# Patient Record
Sex: Female | Born: 2000 | Race: Black or African American | Hispanic: No | Marital: Single
Health system: Southern US, Community
[De-identification: ages and names within clinical notes are randomized; demographics above are authoritative.]

---
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
## 2022-02-02 IMAGING — US US ABDOMEN LIMITED RUQ/ASCITES
1 series · 14 of 25 positions shown · non-contrast
Comparison: None.

CLINICAL DATA: Right upper quadrant pain

EXAM:
ULTRASOUND ABDOMEN LIMITED RIGHT UPPER QUADRANT

[Series 1: us abdomen limited ruq/ascites · 14 of 42 slices shown]
[im 1/42]
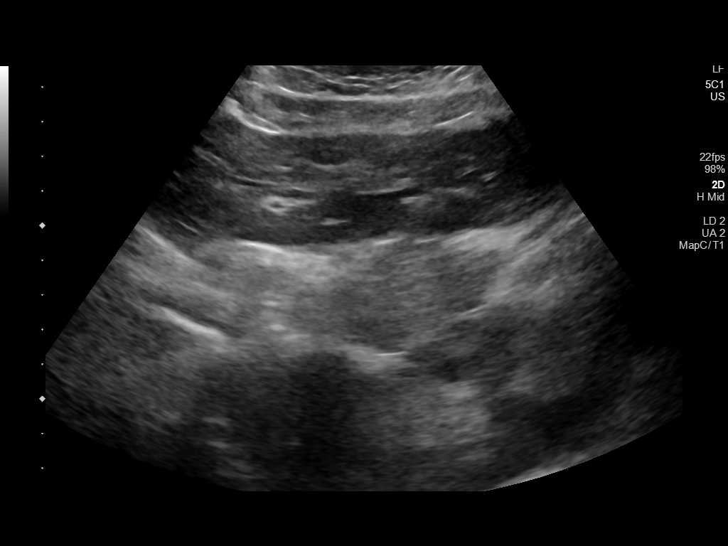
[im 4/42]
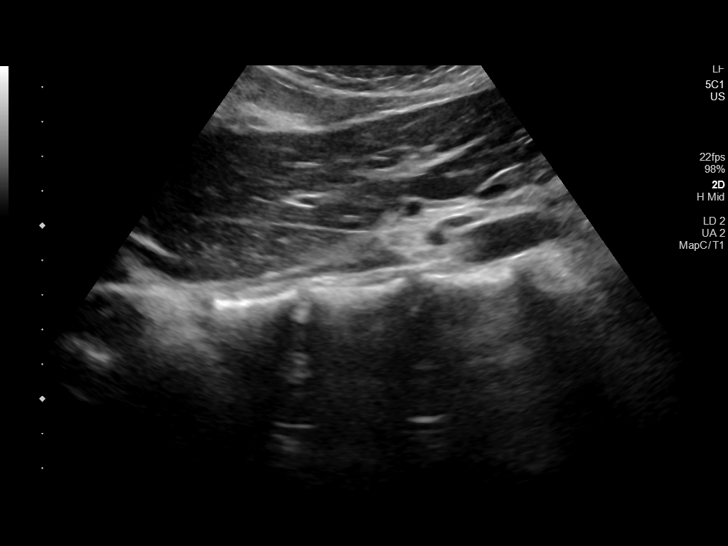
[im 7/42]
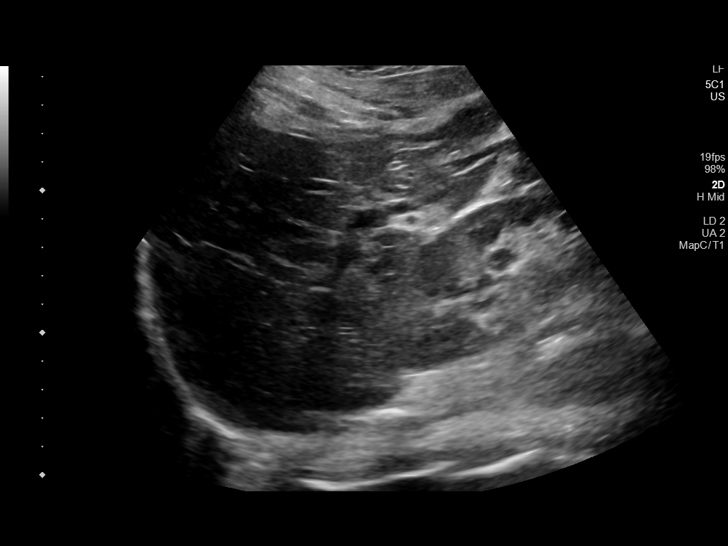
[im 11/42]
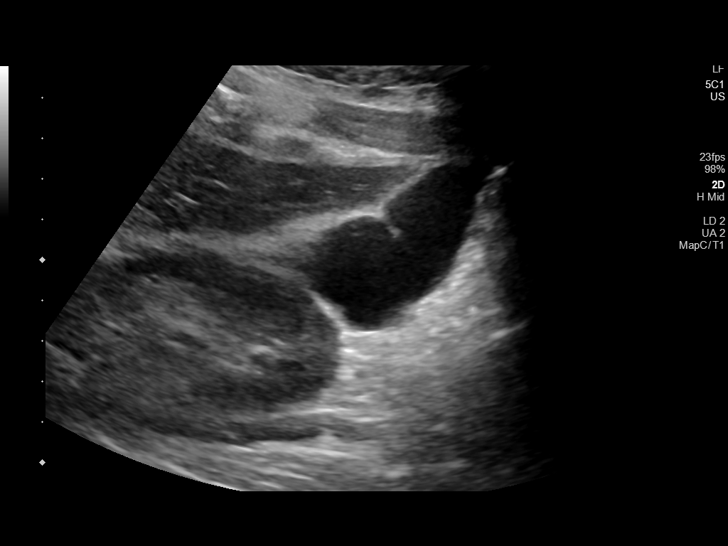
[im 14/42]
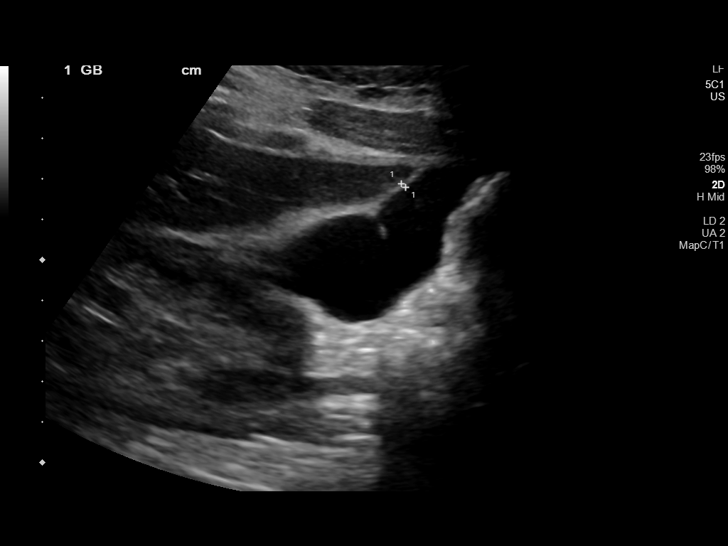
[im 16/42]
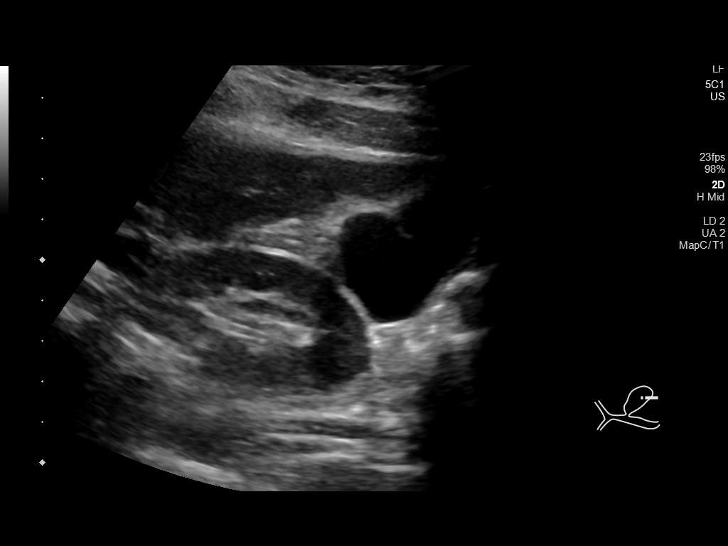
[im 19/42]
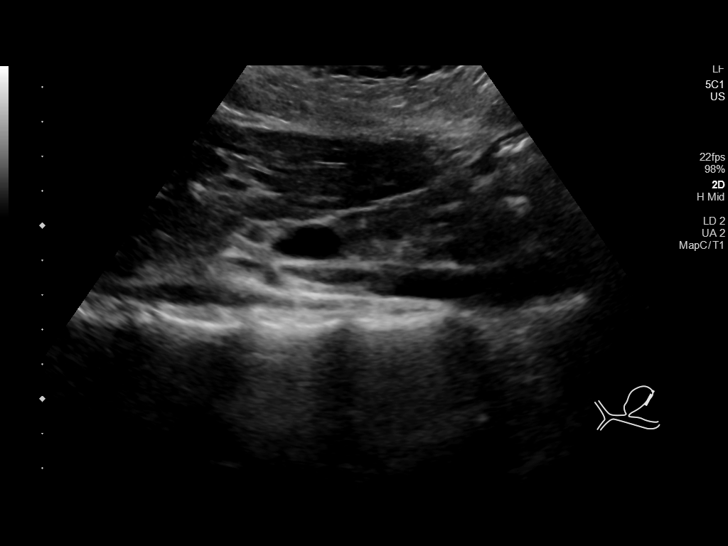
[im 23/42]
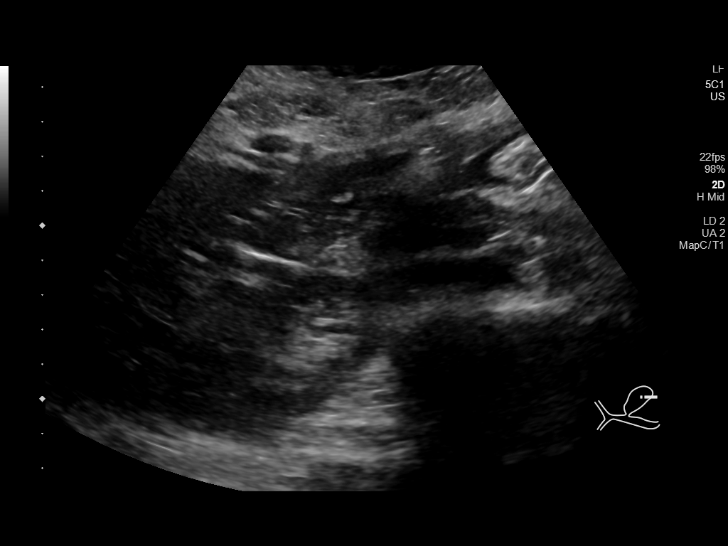
[im 26/42]
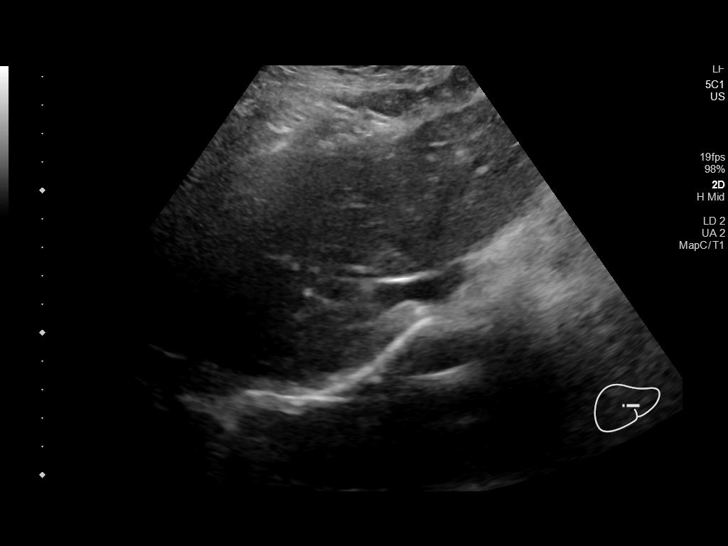
[im 28/42]
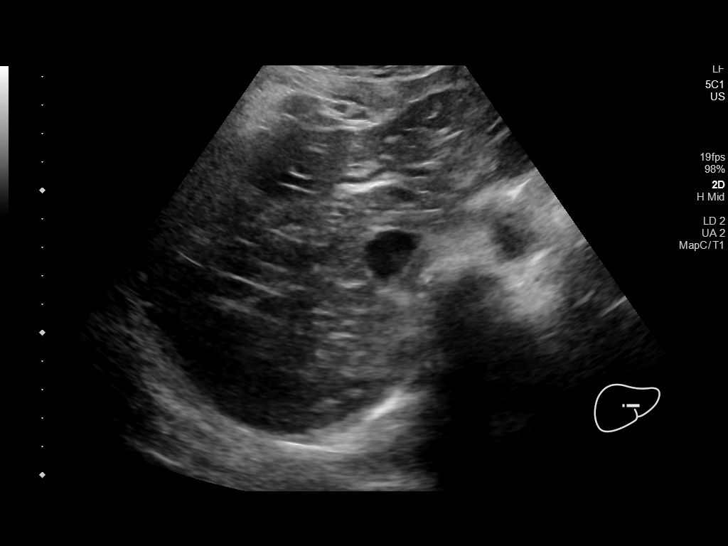
[im 31/42]
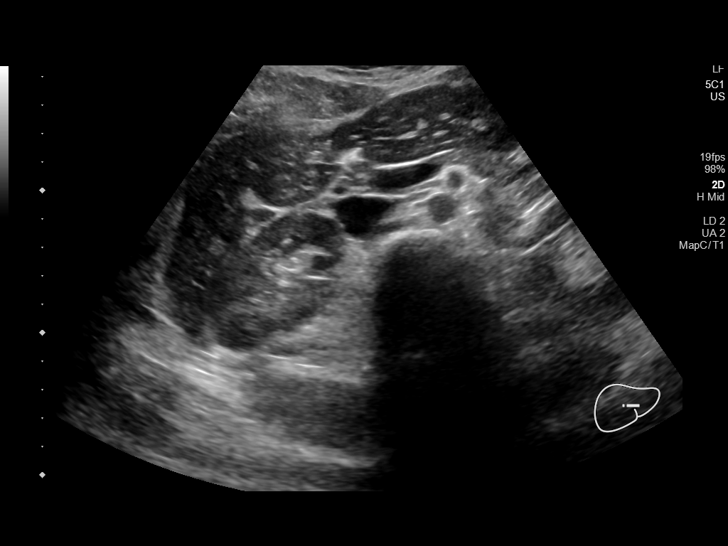
[im 35/42]
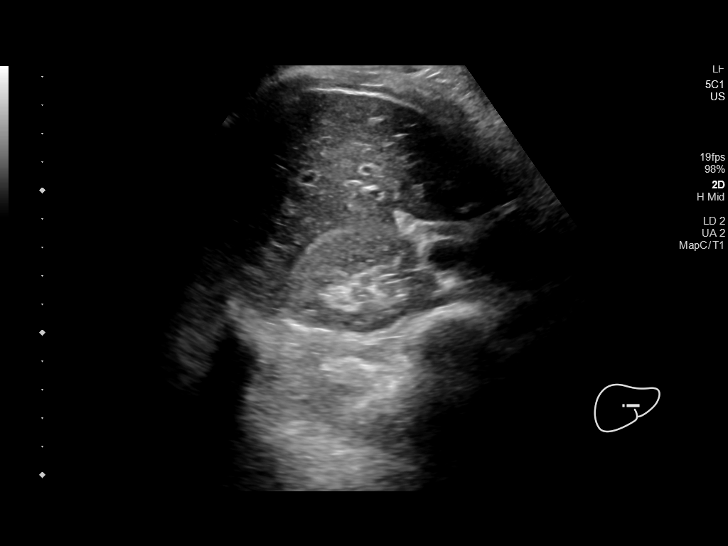
[im 38/42]
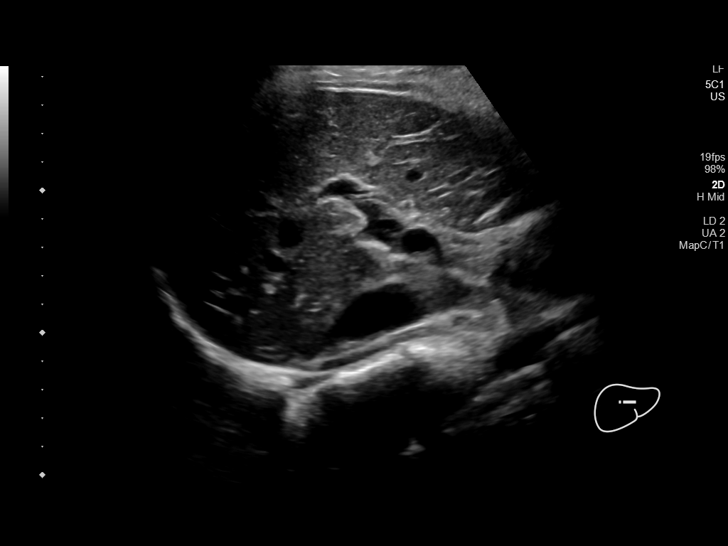
[im 42/42]
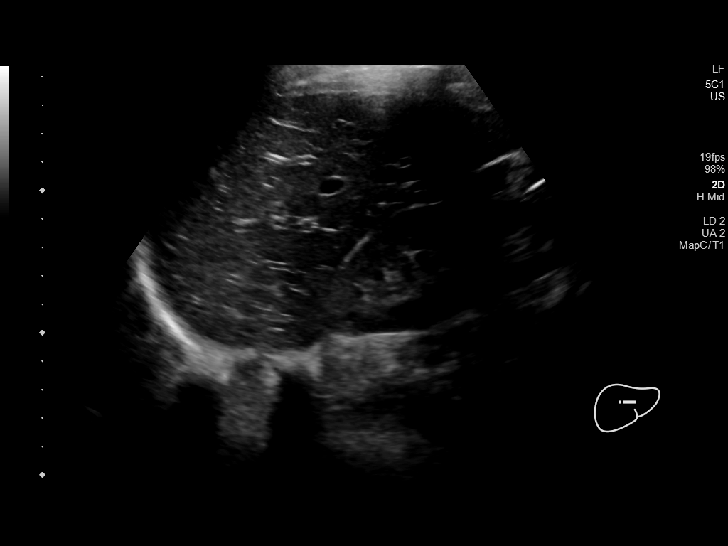

[14 of 25 positions shown; findings below may reference images not displayed]

FINDINGS: Gallbladder:

No gallstones or wall thickening visualized. No sonographic Murphy
sign noted by sonographer.

Common bile duct:

Diameter: 2.5 mm

Liver:

No focal lesion identified. Within normal limits in parenchymal
echogenicity. Portal vein is patent on color Doppler imaging with
normal direction of blood flow towards the liver.

Other: None.
IMPRESSION: Normal sonogram.  No signs of acute cholecystitis.
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
